# Patient Record
Sex: Male | Born: 1995 | Race: White | Hispanic: Yes | Marital: Single | Smoking: Never smoker
Health system: Southern US, Community
[De-identification: ages and names within clinical notes are randomized; demographics above are authoritative.]

## PROBLEM LIST (undated history)

## (undated) DIAGNOSIS — Q605 Renal hypoplasia, unspecified: Secondary | ICD-10-CM

## (undated) DIAGNOSIS — F909 Attention-deficit hyperactivity disorder, unspecified type: Secondary | ICD-10-CM

## (undated) DIAGNOSIS — N189 Chronic kidney disease, unspecified: Secondary | ICD-10-CM

## (undated) DIAGNOSIS — R32 Unspecified urinary incontinence: Secondary | ICD-10-CM

## (undated) HISTORY — DX: Chronic kidney disease, unspecified: N18.9

## (undated) HISTORY — DX: Attention-deficit hyperactivity disorder, unspecified type: F90.9

## (undated) HISTORY — DX: Unspecified urinary incontinence: R32

## (undated) HISTORY — DX: Renal hypoplasia, unspecified: Q60.5

---
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
## 2014-01-22 ENCOUNTER — Ambulatory Visit: Payer: Medicaid Other | Admitting: Pediatrics

## 2014-02-05 ENCOUNTER — Ambulatory Visit (INDEPENDENT_AMBULATORY_CARE_PROVIDER_SITE_OTHER): Payer: No Typology Code available for payment source | Admitting: Clinical

## 2014-02-05 ENCOUNTER — Ambulatory Visit (INDEPENDENT_AMBULATORY_CARE_PROVIDER_SITE_OTHER): Payer: Medicaid Other | Admitting: Pediatrics

## 2014-02-05 ENCOUNTER — Encounter: Payer: Self-pay | Admitting: Pediatrics

## 2014-02-05 VITALS — BP 102/66 | HR 78 | Ht 68.75 in | Wt 181.2 lb

## 2014-02-05 DIAGNOSIS — Z62820 Parent-biological child conflict: Secondary | ICD-10-CM

## 2014-02-05 DIAGNOSIS — F909 Attention-deficit hyperactivity disorder, unspecified type: Secondary | ICD-10-CM

## 2014-02-05 DIAGNOSIS — F4320 Adjustment disorder, unspecified: Secondary | ICD-10-CM

## 2014-02-05 DIAGNOSIS — Z113 Encounter for screening for infections with a predominantly sexual mode of transmission: Secondary | ICD-10-CM

## 2014-02-05 DIAGNOSIS — Z7189 Other specified counseling: Secondary | ICD-10-CM

## 2014-02-05 NOTE — Progress Notes (Signed)
Adolescent Medicine Consultation Initial Visit  Phillip Becker  is a 18 y.o. male referred by Dr. Reginold Becker here today for evaluation of ADHD.      PCP Confirmed?  yes  Phillip Givens, MD   History was provided by the patient, mother and father.  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  He has been seen and followed by Dr. Quentin Becker in the past (though not in the last 12 months) and was taking Vyvanse 50 mg until 2 months ago (mom thinks for about 3 years though is unsure).  Phillip Becker did not want to come to today's appointment  And states he has no goals for the visit but mom's sttaes her goal  is interested in restarting ADHD medication.  Phillip Becker stopped taking his medication 2 months ago because he ran out and states because he did not want to take it anymore.  He notes he has not noted any difference in school performance since stopping medication.  He is scheduled to graduate this Spring and reports he is making Bs.  Mom states she does not know of any changes in school performance.  Phillip Becker is not interested in restarting medication for ADHD and does not feel that his ADHD is currently a problem.  Phillip Becker will be graduating this year and is planning on going on a [place] abroad for two years in December.  Mom is concerned that Phillip Becker seems very irritable and sometimes depressed. Phillip Becker denies symptoms of depression or anxiety.  He denies SI/HI or thoughts of self harm.  He feels his mood is good, he describes himself as happy.  He says that that mood changes his mom is referring to are just "normal teenager stuff."  Mom is also concerned today about Phillip Becker's chronic diarrhea.  He has been evaluated for this by his PCP, though all diagnostic workup has been negative thus  far.  Mom would like a referral to GI. He is followed by [facility] nephrology for his CKD.    Phillip Becker also has a history of enuresis and has taken imipramine for this in the past.  He currently is not taking any medication for enuresis and does not want to take medication.  He reports he has an episode of wetting about twice a month.  He is setting an alarm for 2 am to use the restroom at night.  Mom is frustrated about the enuresis because Phillip Becker will go through many sets of sheets and she has to do the laundry.   No LMP for male patient.  Screenings:  PHQ-SADS PHQ-15:  4 GAD-7:  4 PHQ-9:  3 Reported problems make it not difficult to complete activities of daily functioning.  Adolescent Owens Shark ADD Scales Cluster Subtotals: Activation: 2, T-Score <50 Attention: 15, T-Score 66 Effort: 7, T-Score 53 Affect: 6, T-Score 52 Memory: 3, T-Score <50 Total Score: 30 , T-Score <40   ROS : negative unless otherwise noted in HPI  Current Outpatient Prescriptions on File Prior to Visit  Medication Sig Dispense Refill  . calcitRIOL (ROCALTROL) 0.25 MCG capsule Take 0.25 mcg by mouth once a week. Take 1 capsule (0.25 mcg total) by mouth once a week.      Marland Kitchen lisinopril (PRINIVIL,ZESTRIL) 5 MG tablet  Take 5 mg by mouth. Take 1 tablet (5 mg total) by mouth daily.      . fluticasone (FLONASE) 50 MCG/ACT nasal spray Place 2 sprays into both nostrils daily.  16 g  12  . omeprazole (PRILOSEC) 20 MG capsule Take 1 capsule (20 mg total) by mouth daily.  30 capsule  3    No Known Allergies  PMHx:  Hypoplastic left kidney CKD, stage III ADHD Enuresis Chronic diarrhea  FHX: noncontributory  Social History:  Lives with: mother, father, younger brother, older brother Parental relations: lots of conflict currently Siblings: good relationships  Friends/Peers: good School: attends 12th grade, Bs, graduating this spring Nutrition/Eating Behaviors: Dad reports he will eat anything and does not follow MD  reccomended diet changes Sleep: reports good sleep  Confidentiality was discussed with the patient and if applicable, with caregiver as well. Tobacco? no Secondhand smoke exposure?no Drugs/EtOH?no Sexually active?no Pregnancy Prevention: currently not sexually active Safe at home, in school & in relationships? Yes Safe to self? Yes  Physical Exam:  Filed Vitals:   02/05/14 0950  BP: 102/66  Pulse: 78  Height: 5' 8.75" (1.746 m)  Weight: 181 lb 3.2 oz (82.192 kg)   BP 102/66  Pulse 78  Ht 5' 8.75" (1.746 m)  Wt 181 lb 3.2 oz (82.192 kg)  BMI 26.96 kg/m2 Body mass index: body mass index is 26.96 kg/(m^2). 4.0% systolic and 98.1% diastolic of BP percentile by age, sex, and height. 139/92 is approximately the 95th BP percentile reading.  Gen: well appearing male adolescent, NAD HEENT: AT/[place], MMM Neuro: no focal deficits, alert Psych: flat affect noted at the beginning of visit though this improved with patient   Patient and/or legal guardian verbally consented to meet with [place] about presenting concerns.  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  Patient is not interested in medication and Owens Shark scale is not indicative of ADHD (positive for mild to moderate inattention).  Major issue right now seems parent-child struggle with independence and responsibilities.  Bailey is eager to be treated like an adult and make his own decisions but his parents are frustrated with his lack of responsibility.   Specific areas of frustration include waking up in the morning on his own with an alarm and his decision to not take medication for enuresis but lack of responsibility for cleaning his own sheets.  1. ADHD  - hold off on medication for now as patient is not interested and does not report active symptoms or concerns  2. Adjustment disorder -  patient and family met with Sherilyn Dacosta and discussed strategies at home to deal with some of the independence issues - f/u with Sherilyn Dacosta in 2 weeks   3. Enuresis - pt does not want medication at this time, agreed that [facility] needs to be responsible for cleaning his own linens if bed wetting occurs, agreed that privileges (aka skate boarding) with be taken away if he does not follow up with this  4. Chronic Diarrhea - refer to [facility] peds GI  Medical decision-making:  > 80 minutes spent, more than 50% of appointment was spent discussing diagnosis and management of symptoms

## 2014-02-05 NOTE — Progress Notes (Signed)
Referring Provider: Dr. Marina GoodellPerry & Dr. Zonia KiefStephens Primary Care Provider: Dr. Davis GourdA. Kavanaugh Session Time:  1100 - 1145 (45 minutes) Type of Service: Behavioral Health - Individual Interpreter: no  Interpreter Name & Language: N/A   PRESENTING CONCERNS:  Phillip Becker is a 18 y.o. male brought in by mother and father.  Phillip Becker and his family was referred to this Behavioral Health Clinician to develop a goal they were willing to work on together to decrease family conflict.   GOALS ADDRESSED:  Enhance parent child communication to identify a objectives to complete one goal.    INTERVENTIONS:  This Behavioral Health Clinician built rapport, facilitated communication & completed motivational interviewing to enhance communication & identify one goal they were all willing to work on.  [facility] identified the family's strengths and strategies they have used that have been effective.   ASSESSMENT/OUTCOME:  Phillip Becker and his parents were frustrated & overwhelmed with trying various strategies to get Phillip Becker out of bed in the morning, which caused other stressors.  Phillip Becker and his parents were open in discussing their thoughts & feelings.  They were able to identify Phillip Becker's strengths & barriers that they were addressing.  The family developed a plan for Phillip Becker to continue using both alarms clocks in his room using music with one of them. Phillip Becker was able to identify one positive thought to replace his thought of not wanting to get up in the morning.   PLAN:  Phillip Becker to continue using the rock music to wake him up, say to himself "Put my A game on" instead of "I don't want to get out of bed" and put visual reminders of why he wants to get up in the morning.  Scheduled next visit: 02/18/14  Phillip Becker, MSW, Johnson & JohnsonLCSW Behavioral Health Clinician [facility]

## 2014-02-05 NOTE — Progress Notes (Deleted)
Adolescent Medicine Consultation Initial Visit Phillip Becker  is a 18 y.o. male referred by *** here today for evaluation of ***.      PCP Confirmed?  {YES [id number]}  Angelina PihKAVANAUGH,ALISON S, MD   History was provided by the {relatives:19415}.  Chart review:  ***   No LMP for male patient.  Last STI screen: *** Other Labs: *** Immunizations: ***  HPI:  Pt reports ***  ROS Menstrual History: ***  Problem List Reviewed:  {yes/no:310449} Medication List Reviewed:   {yes/no:310449} Past Medical History Reviewed:  {yes/no:310449} Family History Reviewed:  {yes/no:310449}  Social History: Confidentiality was discussed with the patient and if applicable, with caregiver as well.  Lives with: *** Parental relations: *** Siblings: *** Friends/Peers: *** School: *** Nutrition/Eating Behaviors: *** Sports/Exercise:  *** Screen time: *** Sleep: ***  Tobacco?  {yes***/no:17258}  Secondhand smoke exposure? {yes***/no:17258} Drugs/EtOH? {yes***/no:17258}  Sexually active? {yes***/no:17258}  Safe at home, in school & in relationships? {yes***/no:17258}  Last STI Screening:*** Pregnancy Prevention: ***  Screenings: The patient completed the Rapid Assessment for Adolescent Preventive Services screening questionnaire and the following topics were identified as risk factors and discussed:{CHL AMB ASSESSMENT TOPICS:21012045}    Additional Screening:  ***  {Common ambulatory SmartLinks:19316}  Physical Exam:  Filed Vitals:   02/05/14 0950  BP: 102/66  Pulse: 78  Height: 5' 8.75" (1.746 m)  Weight: 181 lb 3.2 oz (82.192 kg)   BP 102/66  Pulse 78  Ht 5' 8.75" (1.746 m)  Wt 181 lb 3.2 oz (82.192 kg)  BMI 26.96 kg/m2 Body mass index: body mass index is 26.96 kg/(m^2). 3.9% systolic and 28.3% diastolic of BP percentile by age, sex, and height. 139/92 is approximately the 95th BP percentile reading.  ***  Assessment/Plan: ***  Medical decision-making:  - *** minutes spent,  more than 50% of appointment was spent discussing diagnosis and management of symptoms

## 2014-02-06 LAB — GC/CHLAMYDIA PROBE AMP, URINE
Chlamydia, Swab/Urine, PCR: NEGATIVE
GC PROBE AMP, URINE: NEGATIVE

## 2014-02-14 NOTE — Progress Notes (Signed)
Attending Physician Co-Signature  I saw and evaluated the patient, performing the key elements of the service.  I developed  the management plan that is described in the resident's note, and I agree with the content.  Kathlee Barnhardt [place] Bartlomiej Jenkinson, MD  

## 2014-02-18 ENCOUNTER — Encounter: Payer: Self-pay | Admitting: Clinical

## 2014-09-21 ENCOUNTER — Encounter: Payer: Self-pay | Admitting: Pediatrics

## 2014-09-21 ENCOUNTER — Ambulatory Visit (INDEPENDENT_AMBULATORY_CARE_PROVIDER_SITE_OTHER): Payer: Medicaid Other | Admitting: Pediatrics

## 2014-09-21 VITALS — BP 100/70 | Wt 186.1 lb

## 2014-09-21 DIAGNOSIS — Z23 Encounter for immunization: Secondary | ICD-10-CM

## 2014-09-21 DIAGNOSIS — M5489 Other dorsalgia: Secondary | ICD-10-CM

## 2014-09-21 DIAGNOSIS — M549 Dorsalgia, unspecified: Secondary | ICD-10-CM | POA: Insufficient documentation

## 2014-09-21 NOTE — Assessment & Plan Note (Signed)
I encouraged good posture, regular exercise.  Referral to ortho regarding the back pain and mom's concern about osteopenia.  Ibuprofen PRn.  Sugggested that martial arts might be a good fit for him and help him to learn better body position.

## 2014-09-21 NOTE — Progress Notes (Signed)
Subjective:    Phillip Becker is a 18 y.o. old male here with his mother for Back Pain .    Back Pain This is a chronic problem. The current episode started more than 1 year ago. The problem is unchanged. Pain location: lower back - left side extending up to flank. The pain is mild. Worse during: worse in the morning or after sitting for a long time (ie playing his instrument) The symptoms are aggravated by sitting and lying down. Risk factors: history of chronic kidney disease, history of scoliosis. He has tried nothing for the symptoms.    He states he has had back pain all his life.  It is not really worse now.  The reason mom brought him in is really that she is more concerned about his posture.  She wants to know if something is wrong with his bones, she understood from his nephrologist at some point that he might have osteopenia.  She would like for him to see a specialist.    He feels better with the back pain when he regularly engages in exercise such as running and jumproping.  These activities do not make his pain worse.  He has no radiculopathy.   Mom states he needs refills and lab orders related to his chronic kidney disease.  I suggested she get in touch with his nephrologist and she agreed.   Review of Systems  Musculoskeletal: Positive for back pain.    History and Problem List: Phillip Becker has GERD (gastroesophageal reflux disease); Chronic diarrhea; ADHD (attention deficit hyperactivity disorder); Hypoplastic Kidney; Chronic kidney disease (CKD), stage III (moderate); Enuresis; Overweight, pediatric, BMI 85.0-94.9 percentile for age; Chronic throat clearing; Sinusitis; and Back pain on his problem list.  Phillip Becker  has a past medical history of Chronic kidney disease; ADHD (attention deficit hyperactivity disorder); Enuresis; and Hypoplastic kidney.  Immunizations needed: flu     Objective:    BP 100/70 mmHg  Wt 186 lb 1.6 oz (84.414 kg) Physical Exam  Constitutional: He appears  well-nourished. No distress.  HENT:  Head: Normocephalic and atraumatic.  Eyes: Conjunctivae are normal.  Neck: Normal range of motion.  Musculoskeletal: Normal range of motion. He exhibits no tenderness.       Cervical back: He exhibits normal range of motion, no tenderness and no swelling.       Thoracic back: He exhibits normal range of motion, no tenderness, no bony tenderness and no swelling.       Lumbar back: He exhibits normal range of motion, no tenderness, no swelling and no deformity.       Back:  No obvious  scoliosis  Skin: Skin is warm and dry. Rash (acne over back) noted.  Nursing note and vitals reviewed.      Assessment and Plan:     Phillip Becker was seen today for Back Pain .   Problem List Items Addressed This Visit      Other   Back pain - Primary    I encouraged good posture, regular exercise.  Referral to ortho regarding the back pain and mom's concern about osteopenia.  Ibuprofen PRn.  Sugggested that martial arts might be a good fit for him and help him to learn better body position.    Relevant Orders      Ambulatory referral to Orthopedics    Other Visit Diagnoses    Need for vaccination        Relevant Orders       Flu Vaccine QUAD with presevative (  Completed)       Return for follow up as needed -also due for checkup.  Angelina PihKAVANAUGH,Marquia Costello S, MD

## 2014-09-23 ENCOUNTER — Other Ambulatory Visit (HOSPITAL_COMMUNITY): Payer: Self-pay | Admitting: Orthopedic Surgery

## 2014-09-23 DIAGNOSIS — M810 Age-related osteoporosis without current pathological fracture: Secondary | ICD-10-CM

## 2014-09-30 ENCOUNTER — Encounter: Payer: Self-pay | Admitting: Pediatrics

## 2014-10-11 ENCOUNTER — Ambulatory Visit (HOSPITAL_COMMUNITY): Admission: RE | Admit: 2014-10-11 | Payer: Self-pay | Source: Ambulatory Visit

## 2014-10-13 ENCOUNTER — Ambulatory Visit (HOSPITAL_COMMUNITY)
Admission: RE | Admit: 2014-10-13 | Discharge: 2014-10-13 | Disposition: A | Discharge status: Home / Self Care | Payer: Medicaid Other | Source: Ambulatory Visit | Attending: Orthopedic Surgery | Admitting: Orthopedic Surgery

## 2014-10-13 DIAGNOSIS — M810 Age-related osteoporosis without current pathological fracture: Secondary | ICD-10-CM

## 2014-10-13 DIAGNOSIS — Z13820 Encounter for screening for osteoporosis: Secondary | ICD-10-CM | POA: Diagnosis not present

## 2014-10-19 ENCOUNTER — Encounter: Payer: Self-pay | Admitting: Pediatrics

## 2014-10-19 DIAGNOSIS — M40204 Unspecified kyphosis, thoracic region: Secondary | ICD-10-CM | POA: Insufficient documentation

## 2015-04-19 ENCOUNTER — Encounter: Payer: Self-pay | Admitting: Pediatrics

## 2015-04-19 ENCOUNTER — Ambulatory Visit (INDEPENDENT_AMBULATORY_CARE_PROVIDER_SITE_OTHER): Payer: Medicaid Other | Admitting: Pediatrics

## 2015-04-19 VITALS — BP 124/60 | Ht 68.5 in | Wt 194.8 lb

## 2015-04-19 DIAGNOSIS — Z68.41 Body mass index (BMI) pediatric, 85th percentile to less than 95th percentile for age: Secondary | ICD-10-CM

## 2015-04-19 DIAGNOSIS — Z113 Encounter for screening for infections with a predominantly sexual mode of transmission: Secondary | ICD-10-CM

## 2015-04-19 DIAGNOSIS — Z789 Other specified health status: Secondary | ICD-10-CM | POA: Diagnosis not present

## 2015-04-19 DIAGNOSIS — N183 Chronic kidney disease, stage 3 (moderate): Secondary | ICD-10-CM

## 2015-04-19 DIAGNOSIS — Z00121 Encounter for routine child health examination with abnormal findings: Secondary | ICD-10-CM

## 2015-04-19 DIAGNOSIS — Z23 Encounter for immunization: Secondary | ICD-10-CM

## 2015-04-19 NOTE — Patient Instructions (Addendum)
  Place appropriate patient instructions here.  Date Type Specialty Providers Description  08/10/2015 Appointment Nephrology Larose HiresSorkin, Jeremy J, MD  [address]  [place], [place] [address]  [phone]  [phone] (Fax)

## 2015-04-19 NOTE — Progress Notes (Signed)
Routine Well-Adolescent Visit   PCP: Jairo Ben, MD   History was provided by the patient.  Phillip Becker is a 19 y.o. male who is here for [facility].   Current concerns: No concerns.   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   Plans to stay for 2 years. Location of mission trip unknown currently. Followed up with pediatric nephrologist Dr. Ronnell Guadalajara at [facility] on 04/05/2015.    Patient indicates previous GFR was high and creatinine improved.     Adolescent Assessment:  Confidentiality was discussed with the patient and if applicable, with caregiver as well.  Home and Environment:  Lives with: lives at home with mom, dad, younger brother  Parental relations: Good relationships.  Friends/Peers: Good relationships.  Nutrition/Eating Behaviors: Balanced diet, with some fast food  Sports/Exercise:  Gym, running, free weights:  6 days/week, 3-4 hours a day    Education and Employment:  School Status: not in school School History: Graduated from [employer], plans to do mission work. 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   Patient reports being comfortable and safe at  at home? Yes  Smoking: no Secondhand smoke exposure? no Drugs/EtOH: No.     Sexuality:  -Menarche: not applicable in this male child. - Sexually active? No, remaining abstinence  - sexual partners in last year: zero.  - contraception use: abstinence - Last STI Screening: 02/05/14, 04/19/15  - Violence/Abuse: None.   Mood: Suicidality and Depression: None.  Weapons: Access.   Screenings: The patient completed the Rapid Assessment for Adolescent Preventive Services screening questionnaire and the following topics were identified as  risk factors and discussed: healthy eating, exercise and weapon use  In addition, the following topics were discussed as part of anticipatory guidance healthy peer interactions.Marland Kitchen  PHQ-9 completed and results indicated no current risk of depression.    Physical Exam:  BP 124/60 mmHg  Ht 5' 8.5" (1.74 m)  Wt 194 lb 12.8 oz (88.361 kg)  BMI 29.19 kg/m2 Blood pressure percentiles are 61% systolic and 6% diastolic based on 2000 NHANES data.   General Appearance:   alert, oriented, no acute distress.  Polite and cooperative young man.   HENT: Normocephalic, no obvious abnormality, PERRL, EOM's intact, conjunctiva clear  Mouth:   Normal appearing teeth, no obvious discoloration, dental caries, or dental caps  Neck:   Supple  Lungs:   Clear to auscultation bilaterally, normal work of breathing  Heart:   Regular rate and rhythm, S1 and S2 normal, no murmurs;   Abdomen:   Soft, non-tender, no mass, or organomegaly  GU normal male genitals, no testicular masses or hernia  Musculoskeletal:   Tone and strength strong and symmetrical, all extremities               Lymphatic:   No cervical adenopathy  Skin/Hair/Nails:   Skin warm, dry and intact, no rashes, no bruises or petechiae  Neurologic:   Strength, gait, and coordination normal and age-appropriate    Assessment/Plan:  Phillip Becker is a 19 y.o. male with a past medical history of hypoplastic kidney disease.  Plan to transition to adult medical care.   1. Routine screening for STI (sexually transmitted infection) -Previous STI screening GC/chlamydia negative (  2015) - GC/chlamydia probe amp, urine  2. Need for vaccination - Td vaccine greater than or equal to 7yo preservative free IM  3. 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 However will contact Dr.  Ronnell GuadalajaraKeisha Gibson for travel recommendations.    4. Body mass index, pediatric, 85th percentile to less than 95th percentile for age -BMI: is not appropriate for age -Encouraged patient to remain physically active  -Promoted healthy eating   5. Engages in travel abroad - Patient plans to complete mission work in the next few months.  -TB screening required prior to leaving.   -Ordered PPD -Pt unsure of the destination of mission trip at this time.  Plans to live in the area for 2 years.  -Follow-up visit in 3 days for next visit for PPD check, or sooner as needed.   Immunization status: up to date and documented.    Lavella HammockEndya Catlin Doria, MD

## 2015-04-20 LAB — GC/CHLAMYDIA PROBE AMP, URINE
CHLAMYDIA, SWAB/URINE, PCR: NEGATIVE
GC PROBE AMP, URINE: NEGATIVE

## 2015-04-20 NOTE — Progress Notes (Signed)
I saw and evaluated the patient, performing the key elements of the service. I developed the management plan that is described in the resident's note, and I agree with the content.  This patient has CKD with hypoplastic kidneys. His renal function is stable. He is on blood pressure meds and calcitriol. He has been approved for a 2 year mission trip as long as he has access to nephrology, medical care, and necessary medications.   Nani Ingram D                  04/20/2015, 9:49 AM

## 2015-04-21 ENCOUNTER — Ambulatory Visit: Payer: Medicaid Other | Admitting: *Deleted

## 2015-04-21 DIAGNOSIS — Z111 Encounter for screening for respiratory tuberculosis: Secondary | ICD-10-CM

## 2015-04-21 LAB — TB SKIN TEST
Induration: 0 mm
TB Skin Test: NEGATIVE

## 2016-03-16 ENCOUNTER — Emergency Department (HOSPITAL_COMMUNITY)
Admission: EM | Admit: 2016-03-16 | Discharge: 2016-03-16 | Disposition: A | Discharge status: Home / Self Care | Payer: Worker's Compensation | Attending: Emergency Medicine | Admitting: Emergency Medicine

## 2016-03-16 ENCOUNTER — Encounter (HOSPITAL_COMMUNITY): Payer: Self-pay | Admitting: Oncology

## 2016-03-16 DIAGNOSIS — Y929 Unspecified place or not applicable: Secondary | ICD-10-CM | POA: Insufficient documentation

## 2016-03-16 DIAGNOSIS — Y9389 Activity, other specified: Secondary | ICD-10-CM | POA: Diagnosis not present

## 2016-03-16 DIAGNOSIS — S61209A Unspecified open wound of unspecified finger without damage to nail, initial encounter: Secondary | ICD-10-CM

## 2016-03-16 DIAGNOSIS — F909 Attention-deficit hyperactivity disorder, unspecified type: Secondary | ICD-10-CM | POA: Insufficient documentation

## 2016-03-16 DIAGNOSIS — S61012A Laceration without foreign body of left thumb without damage to nail, initial encounter: Secondary | ICD-10-CM | POA: Diagnosis not present

## 2016-03-16 DIAGNOSIS — W269XXA Contact with unspecified sharp object(s), initial encounter: Secondary | ICD-10-CM | POA: Insufficient documentation

## 2016-03-16 DIAGNOSIS — S6992XA Unspecified injury of left wrist, hand and finger(s), initial encounter: Secondary | ICD-10-CM | POA: Diagnosis present

## 2016-03-16 DIAGNOSIS — Y990 Civilian activity done for income or pay: Secondary | ICD-10-CM | POA: Diagnosis not present

## 2016-03-16 DIAGNOSIS — N189 Chronic kidney disease, unspecified: Secondary | ICD-10-CM | POA: Insufficient documentation

## 2016-03-16 MED ORDER — BACITRACIN 500 UNIT/GM EX OINT: 1.0000 "application " | TOPICAL_OINTMENT | Freq: Two times a day (BID) | CUTANEOUS | Status: AC

## 2016-03-16 MED ORDER — TETANUS-DIPHTH-ACELL PERTUSSIS 5-2.5-18.5 LF-MCG/0.5 IM SUSP
0.5000 mL | Freq: Once | INTRAMUSCULAR | Status: AC
  Administered 2016-03-16: 0.5 mL via INTRAMUSCULAR
  Filled 2016-03-16: qty 0.5

## 2016-03-16 NOTE — ED Notes (Signed)
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  Pt brought the severed piece of thumb with him.  Bleeding is controlled at this time. Pt rates pain 8/10, throbbing/burning in nature

## 2016-03-16 NOTE — Progress Notes (Signed)
Left thumb lac site cleansed and new dressing applied.

## 2016-03-16 NOTE — Discharge Instructions (Signed)
Read the information below.  Use the prescribed medication as directed.  Please discuss all new medications with your pharmacist.  You may return to the Emergency Department at any time for worsening condition or any new symptoms that concern you.    If you develop redness, swelling, pus draining from the wound, or fevers greater than 100.4, return to the ER immediately for a recheck.     Traumatic Finger Amputation A traumatic finger amputation is when you lose part or all of a finger because of an accident or injury. The severity of this type of injury can vary widely. It can mean that just the tip of your finger gets ripped off (avulsion), or it can mean that you completely lose a finger (amputation). Traumatic finger amputation is a medical emergency. It requires immediate care to prevent further damage and to save the finger. CAUSES A traumatic finger amputation usually results from an accident that involves:  A car.  Power tools.  Factory work.  Farm or Risk managerlawn equipment. SYMPTOMS  Symptoms of a traumatic finger amputation include:  Bleeding.  Pain.  Damage to surrounding tissues, such as bones, muscles, tendons, and skin. Severe injuries can sometimes lead to shock, which is a life-threatening condition in which your body fails to get blood, oxygen, and nutrients to vital organs. Some common symptoms of shock include low blood pressure, sweating, weakness, and pale skin. DIAGNOSIS Your health care provider will do a physical exam and ask for details about how your injury occurred. The physical exam will help your health care provider to determine the severity of the injury and the best way to repair it. X-rays may be done to check for damage to the surrounding bones and tissues. TREATMENT Treatment depends on the type of injury that you have and how bad it is.  Your health care provider will clean the wound thoroughly and apply a medicine (anesthetic) to relieve pain.  If just the  tip of your finger was removed, the wound will typically heal on its own with a protective bandage (dressing) and regular cleaning.  For more severe injuries, a portion of skin may need to be taken from another part of the body (graft) and attached to the wound site until the wound heals.  If a large portion of the finger was amputated, it may be possible to reattach it surgically (replantation). HOME CARE INSTRUCTIONS  Take medicines only as directed by your health care provider.  If you were prescribed an antibiotic medicine, finish all of it even if you start to feel better.  There are many different ways to close and cover a wound, including stitches (sutures), skin glue, and adhesive strips. Follow your health care provider's instructions about:  Wound care.  Dressing changes and removal.  Wound closure removal.  Check your wound every day for signs of infection. Watch for:  Redness, swelling, or pain.  Fluid, blood, or pus.  Do exercises to strengthen your finger and hand as directed by your health care provider. SEEK MEDICAL CARE IF:  Your wound does not seem to be healing well.  You have redness, swelling, or pain at your wound site.  You have fluid, blood, or pus coming from your wound.  You have a fever.   This information is not intended to replace advice given to you by your health care provider. Make sure you discuss any questions you have with your health care provider.   Document Released: 12/10/2001 Document Revised: 10/22/2014 Document Reviewed: 06/16/2014  Chartered certified accountant Patient Education [employer].

## 2016-03-16 NOTE — ED Provider Notes (Signed)
CSN: [id number]     Arrival date & time 03/16/16  2021 History  By signing my name below, I, Ronney Lion, attest that this documentation has been prepared under the direction and in the presence of [facility], PA-C. Electronically Signed: Ronney Lion, ED Scribe. 03/16/2016. 8:58 PM.    Chief Complaint  Patient presents with  . Finger Injury   The history is provided by the patient. No language interpreter was used.   HPI Comments: Phillip Becker is a 20 y.o. male who presents to the Emergency Department s/p accidentally cutting off the tip of his left thumb while cutting lettuce at work approximately 1 hour PTA. Patient brings with him the severed thumb tip with him, which he had wrapped in a dry towel. He complains of associated pain to the tip of his thumb but otherwise denies any additional injuries. He denies being on any anticoagulants. Patient states he cannot remember the date of his last tetanus vaccination. He denies trying any treatments or medications prior to arrival. Patient states he is right-hand-dominant.  Past Medical History  Diagnosis Date  . Chronic kidney disease   . ADHD (attention deficit hyperactivity disorder)   . Enuresis   . Hypoplastic kidney    History reviewed. No pertinent past surgical history. No family history on file. Social History  Substance Use Topics  . Smoking status: Never Smoker   . Smokeless tobacco: Never Used  . Alcohol Use: No    Review of Systems  Constitutional: Negative for fever and chills.  Musculoskeletal: Negative for myalgias and arthralgias.  Skin: Positive for wound. Negative for color change and pallor.  Allergic/Immunologic: Negative for immunocompromised state.  Neurological: Negative for weakness and numbness.  Hematological: Does not bruise/bleed easily.  Psychiatric/Behavioral: Positive for self-injury (accidental ).   Allergies  Review of patient's allergies indicates no known allergies.  Home Medications   Prior to  Admission medications   Medication Sig Start Date End Date Taking? Authorizing Provider  Ascorbic Acid (VITAMIN C) 1000 MG tablet Take 1,000 mg by mouth.    Historical Provider, MD  calcitRIOL (ROCALTROL) 0.25 MCG capsule Take 0.25 mcg by mouth daily. 04/05/15   Historical Provider, MD  lisinopril (PRINIVIL,ZESTRIL) 5 MG tablet Take 5 mg by mouth. Take 1 tablet (5 mg total) by mouth daily. 04/21/13 04/21/14  Historical Provider, MD  lisinopril (PRINIVIL,ZESTRIL) 5 MG tablet Take 5 mg by mouth daily. 04/05/15   Historical Provider, MD  Melatonin 5 MG CAPS Take 5 mg by mouth. 04/05/15   Historical Provider, MD   BP 149/50 mmHg  Pulse 59  Temp(Src) 98.2 F (36.8 C) (Oral)  Resp 20  Ht  (1.727 m)  Wt 170 lb (77.111 kg)  BMI 25.85 kg/m2  SpO2 99% Physical Exam  Constitutional: He appears well-developed and well-nourished. No distress.  HENT:  Head: Normocephalic and atraumatic.  Neck: Neck supple.  Pulmonary/Chest: Effort normal.  Musculoskeletal:  Left thumb with skin avulsion at tip, tiny distal edge of nail also cut off.  Wound is hemostatic.  Skin fragment that pt transported is thin, pale, dry, and cold.    Neurological: He is alert.  Skin: He is not diaphoretic.  Nursing note and vitals reviewed.   ED Course  Procedures (including critical care time)  DIAGNOSTIC STUDIES: Oxygen Saturation is 99% on RA, normal by my interpretation.    COORDINATION OF CARE: 8:57 PM - Discussed treatment plan with pt at bedside which includes wound care. Discussed with pt that  the tip of the thumb cannot be reattached, as the tip of the thumb is no longer viable. Pt verbalized understanding and agreed to plan.   MDM   Final diagnoses:  Fingertip avulsion, initial encounter   Afebrile, nontoxic patient with injury to his injury to his left thumb while cutting vegetables at work.  Wound is not very deep, small piece of skin brought in is not amenable to reattachment.  Neurovascularly intact, no  tendon or bony involvement.  Distal tip of nail cut off but no significant or repairable nailbed injury.   Xray not indicated.  Wound care by nurse.   D/C home with wound care instructions, antibiotic ointment, work restrictions while healing, return precautions.  Discussed result, findings, treatment, and follow up  with patient.  Pt given return precautions.  Pt verbalizes understanding and agrees with plan.     Tdap booster given. Pt has no co morbidities to effect normal wound healing. Pt is hemodynamically stable w no complaints prior to dc.     I personally performed the services described in this documentation, which was scribed in my presence. The recorded information has been reviewed and is accurate.     Trixie Dredgemily Demetris Meinhardt, PA-C 03/16/16 2129  Laurence Spatesachel Morgan Little, MD 03/17/16 [phone]

## 2016-05-02 ENCOUNTER — Encounter: Payer: Self-pay | Admitting: Pediatrics

## 2016-05-02 ENCOUNTER — Ambulatory Visit (INDEPENDENT_AMBULATORY_CARE_PROVIDER_SITE_OTHER): Payer: BLUE CROSS/BLUE SHIELD | Admitting: Pediatrics

## 2016-05-02 VITALS — BP 120/84 | Ht 68.75 in | Wt 171.8 lb

## 2016-05-02 DIAGNOSIS — Z0001 Encounter for general adult medical examination with abnormal findings: Secondary | ICD-10-CM

## 2016-05-02 DIAGNOSIS — N183 Chronic kidney disease, stage 3 unspecified: Secondary | ICD-10-CM

## 2016-05-02 DIAGNOSIS — Z68.41 Body mass index (BMI) pediatric, 85th percentile to less than 95th percentile for age: Secondary | ICD-10-CM

## 2016-05-02 DIAGNOSIS — Q602 Renal agenesis, unspecified: Secondary | ICD-10-CM | POA: Diagnosis not present

## 2016-05-02 DIAGNOSIS — Q605 Renal hypoplasia, unspecified: Secondary | ICD-10-CM

## 2016-05-02 DIAGNOSIS — E663 Overweight: Secondary | ICD-10-CM | POA: Diagnosis not present

## 2016-05-02 DIAGNOSIS — Z113 Encounter for screening for infections with a predominantly sexual mode of transmission: Secondary | ICD-10-CM

## 2016-05-02 LAB — POCT URINALYSIS DIPSTICK
Bilirubin, UA: NEGATIVE
Blood, UA: NEGATIVE
GLUCOSE UA: NEGATIVE
Ketones, UA: NEGATIVE
Leukocytes, UA: NEGATIVE
NITRITE UA: NEGATIVE
Protein, UA: NEGATIVE
Spec Grav, UA: <=1.005
Urobilinogen, UA: NEGATIVE
pH, UA: 7

## 2016-05-02 NOTE — Patient Instructions (Addendum)
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 You will need access to a kidney specialist while you are traveling.  You should take your records from the Kidney Specialist with you and can request them from the office prior to travel. Your next routine appointment is 08/2016. You need to maintain good hydration and eat a low salt diet. If you have headaches, blurred vision, elevated blood pressure, or changes in your urine you must seek medical advice. Continue taking your medications as prescribed by your nephrologist. Make sure you have a prescription to take with you while traveling.   Dental list          updated 1.22.15 These dentists all accept Medicaid.  The list is for your convenience in choosing your child's dentist. Estos dentistas aceptan Medicaid.  La lista es para su Guamconveniencia y es una cortesa.     [facility]     [phone] [address]  [address] [place] [address] Se habla espaol From 481 to 21 years old Parent may go with child Vinson MoselleBryan Cobb DDS     [phone] [address] [place]  [id number] Se habla espaol From 302 to 21 years old Parent may NOT go with child  Marolyn HammockSilva and Silva DMD    [phone] [address] [place] [address] Se habla espaol [place]Vietnamese spoken From 21 years old Parent may go with child [facility]     [phone] [address] [place] [id number] Se habla espaol From 371 to 21 years old Parent may NOT go with child  Winfield Rasthane Hisaw DDS     [phone] [facility]      [address]  [place] [address] No se habla espaol From teeth coming in Parent may go with child  [facility]     [phone] [address] [place] [address] Requires certification. Call for information. Requiere certificacin. Llame para informacin. Algunos dias se habla espaol  From birth to 20 years Parent possibly goes with child  Bradd CanaryHerbert McNeal DDS      [phone] [id number] [phone] [address]  [address] [place] [address] Se habla espaol From 18 months to 18 years  Parent may go with child  J. Jemez SpringsHoward McMasters DDS    [phone] Garlon HatchetEric J. Sadler DDS [address] [place] [address] Se habla espaol From 21 year old Parent may go with child  Melynda Rippleerry Jeffries DDS    [phone] [address] [place] [address] Se habla espaol  From 5418 months old Parent may go with child Dorian PodJ. Selig Cooper DDS    [phone] [address] [place] [address] Se habla espaol From 465 to 641 years old Parent may go with child  [facility]    [phone] [address] [place] [address] No se habla espaol From birth Parent may not go with child

## 2016-05-02 NOTE — Progress Notes (Signed)
Adolescent Well Care Visit Fedrick A Madelyn Flavorsrgueta is a 20 y.o. male who is here for well care.    PCP:  Jairo BenMCQUEEN,Mael Delap D, MD   History was provided by the patient and mother.  Current Issues: Current concerns include Here for annual CPE. Will turn 20 in 2 months. He plans to go on a mission trip for 2 years with the [employer]. He does not where he is going yet but he will be assigned to go somewhere with access to medical care.  Prior Concerns: CKD and hypoplastic kidneys bilaterally. [facility] but [facility]. He has stable Stage 3 CKD with BUN/Cr 20/2.02. He was not compliant with lisinopril 5 mg daily and the need to take it was reinforced at a recent visit 02/2016. He reports that he is now compliant with mediction. He does not take calcitriol regularly because it upsets his stomach.  Nutrition: Nutrition/Eating Behaviors: vegan. Almond milk with Vit D. Beans and rice.  Adequate calcium in diet?: No and not taking calcitriol Supplements/ Vitamins: multivitamon  Exercise/ Media: Play any Sports?/ Exercise: active Screen Time:  < 2 hours Media Rules or Monitoring?: yes  Sleep:  Sleep: No current problems, No longer taking melatonin  Social Screening: Lives with:  Mom and Dad but about to leave for 2 years Parental relations:  good Activities, Work, and Regulatory affairs officerChores?: yes Concerns regarding behavior with peers?  no Stressors of note: no  Education: School Name: Completed school. Plans college. Wants to study acting    Menstruation:   No LMP for male patient.   Tobacco?  no Secondhand smoke exposure?  no Drugs/ETOH?  no  Sexually Active?  no   Pregnancy Prevention: abstinence  Safe at home, in school & in relationships?  Yes Safe to self?  Yes   Screenings: Patient has a dental home: no - list given  The patient completed the Rapid Assessment for Adolescent Preventive Services screening questionnaire and the following  topics were identified as risk factors and discussed: weapon use  In addition, the following topics were discussed as part of anticipatory guidance healthy eating, exercise, weapon use, tobacco use, marijuana use, drug use, condom use and birth control.  PHQ-9 completed and results indicated no current concerns. Score 0. Passed history of adjustment issues. Currently denies. He is looking forward to mission work and returning to go to college and study acting. He no longer has sleep problems and is exercising regularly.  Physical Exam:  Filed Vitals:   05/02/16 1456  BP: 120/84  Height: 5' 8.75" (1.746 m)  Weight: 171 lb 12.8 oz (77.928 kg)  Facility age limit for growth percentiles is 20 years.  BP 120/84 mmHg  Ht 5' 8.75" (1.746 m)  Wt 171 lb 12.8 oz (77.928 kg)  BMI 25.56 kg/m2 Body mass index: body mass index is 25.56 kg/(m^2). Facility age limit for growth percentiles is 20 years.   Hearing Screening   Method: Audiometry   125Hz  250Hz  500Hz  1000Hz  2000Hz  4000Hz  8000Hz   Right ear:   20 20 20 20    Left ear:   20 20 20 20      Visual Acuity Screening   Right eye Left eye Both eyes  Without correction: 20/20 20/20   With correction:       General Appearance:   alert, oriented, no acute distress and well nourished  HENT: Normocephalic, no obvious abnormality, conjunctiva clear  Mouth:   Normal appearing teeth, no obvious discoloration,  dental caries, or dental caps  Neck:   Supple; thyroid: no enlargement, symmetric, no tenderness/mass/nodules     Lungs:   Clear to auscultation bilaterally, normal work of breathing  Heart:   Regular rate and rhythm, S1 and S2 normal, no murmurs;   Abdomen:   Soft, non-tender, no mass, or organomegaly  GU normal male genitals, no testicular masses or hernia, Tanner stage 5  Musculoskeletal:   Tone and strength strong and symmetrical, all extremities               Lymphatic:   No cervical adenopathy  Skin/Hair/Nails:   Skin warm, dry and  intact, no rashes, no bruises or petechiae  Neurologic:   Strength, gait, and coordination normal and age-appropriate     Assessment and Plan:   1. 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 He is planning a mission trip for 2 years-leaving in august. His kidney disease is stable and he will be going to a country with access to medical care. He is currently compliant with his medications.   2. Body mass index, pediatric, 85th percentile to less than 95th percentile for age He is now a vegan and exercises daily. His BMI is stable. He takes calcitriol and a multivitamin daily. He reports adequate protein intake and maintains low salt. He is aware of the need to stay hydrated.  3. Overweight As above  4. Chronic kidney disease (CKD), stage III (moderate) Recent appointment with nephrologist record reviewed. I recommend that he consult with nephrologist about his travesl and obtain a medical summery and prescription refills for his travels.  - POCT urinalysis dipstick  5. Hypoplastic Kidney As above  6. Routine screening for STI (sexually transmitted infection)  - GC/Chlamydia Probe Amp   BMI is not appropriate for age  Hearing screening result:normal Vision screening result: normal  Next CPE will be with adult MD   Return for No follow up scheduled. Wil need adult health...  Jairo Ben, MD

## 2016-05-03 LAB — GC/CHLAMYDIA PROBE AMP
CT Probe RNA: NOT DETECTED
GC PROBE AMP APTIMA: NOT DETECTED

## 2016-05-09 ENCOUNTER — Encounter: Payer: Self-pay | Admitting: Pediatrics

## 2016-05-10 ENCOUNTER — Encounter: Payer: Self-pay | Admitting: Pediatrics

## 2016-05-14 ENCOUNTER — Encounter: Payer: Self-pay | Admitting: Pediatrics

## 2016-05-25 ENCOUNTER — Telehealth: Payer: Self-pay | Admitting: Pediatrics

## 2016-05-25 NOTE — Telephone Encounter (Signed)
Please call Phillip Becker as soon form is ready for pick up as soon as possible

## 2016-05-28 NOTE — Telephone Encounter (Signed)
Placed in provider box for completion.

## 2016-05-30 NOTE — Telephone Encounter (Signed)
Form completed by PCP, form copied, and given to front desk for parent to pickup.  

## 2016-05-30 NOTE — Telephone Encounter (Signed)
Called mom and let her know the form is ready to be picked up. °

## 2016-08-27 ENCOUNTER — Encounter: Payer: Self-pay | Admitting: *Deleted

## 2016-08-27 NOTE — Progress Notes (Unsigned)
Screening completed.

## 2016-08-30 ENCOUNTER — Other Ambulatory Visit: Payer: Self-pay | Admitting: Pediatrics

## 2016-08-30 NOTE — Telephone Encounter (Signed)
Called mother back and let her know she needs to follow up with Nephrology and states she plans to do so.

## 2016-08-30 NOTE — Telephone Encounter (Signed)
Medication was prescribed by Dr[facility] Allayne GitelmanKavanaugh here at [facility]. I did prompt mother that this may need to be [facility] considering childs age. However, she states that patient will be leaving for mission trip December 13 and will not be able to get in with new practice by that time. Will route again to physician to consider and advise.

## 2016-08-30 NOTE — Telephone Encounter (Signed)
Pt's mom called requesting a prescription refill for imipramine 25 MG tablet. Stated this medication was prescribed by Dr. Allayne GitelmanKavanaugh couple years ago. Mom knows pt should be transfer to an adult clinic since pt is already 20 y/o.

## 2016-09-04 ENCOUNTER — Ambulatory Visit (INDEPENDENT_AMBULATORY_CARE_PROVIDER_SITE_OTHER): Payer: BLUE CROSS/BLUE SHIELD | Admitting: *Deleted

## 2016-09-04 DIAGNOSIS — Z23 Encounter for immunization: Secondary | ICD-10-CM | POA: Diagnosis not present

## 2019-06-15 ENCOUNTER — Other Ambulatory Visit: Payer: Self-pay

## 2019-06-15 DIAGNOSIS — Z20822 Contact with and (suspected) exposure to covid-19: Secondary | ICD-10-CM

## 2019-06-17 LAB — NOVEL CORONAVIRUS, NAA: SARS-CoV-2, NAA: NOT DETECTED

## 2019-06-19 ENCOUNTER — Telehealth: Payer: Self-pay | Admitting: General Practice

## 2019-06-19 NOTE — Telephone Encounter (Signed)
Negative COVID results given. Patient results "NOT Detected." Caller expressed understanding. ° °

## 2021-11-16 ENCOUNTER — Telehealth: Payer: Self-pay | Admitting: Nurse Practitioner

## 2021-11-16 NOTE — Progress Notes (Deleted)
[facility] [address] [place] [address] Dept: [phone] Dept Fax: [phone]  Virtual Video Visit  I connected with Phillip Becker on 11/16/21 at 10:00 AM EST by a video enabled telemedicine application and verified that I am speaking with the correct person using two identifiers.  Location patient: Home Location provider: Clinic Persons participating in the virtual visit: Patient, Provider, CMA  I discussed the limitations of evaluation and management by telemedicine and the availability of in person appointments. The patient expressed understanding and agreed to proceed.  No chief complaint on file.   SUBJECTIVE:  HPI: Phillip Becker is a 26 y.o. male who presents for new patient visit to establish care.  Introduced to Designer, jewellery role and practice setting.  All questions answered.  Discussed provider/patient relationship and expectations. He endorses cough, congestion.   UPPER RESPIRATORY TRACT INFECTION  Worst symptom: Fever: {Blank single:19197::"yes","no"} Cough: {Blank single:19197::"yes","no"} Shortness of breath: {Blank single:19197::"yes","no"} Wheezing: {Blank single:19197::"yes","no"} Chest pain: {Blank single:19197::"yes","no","yes, with cough"} Chest tightness: {Blank single:19197::"yes","no"} Chest congestion: {Blank single:19197::"yes","no"} Nasal congestion: {Blank single:19197::"yes","no"} Runny nose: {Blank single:19197::"yes","no"} Post nasal drip: {Blank single:19197::"yes","no"} Sneezing: {Blank single:19197::"yes","no"} Sore throat: {Blank single:19197::"yes","no"} Swollen glands: {Blank single:19197::"yes","no"} Sinus pressure: {Blank single:19197::"yes","no"} Headache: {Blank single:19197::"yes","no"} Face pain: {Blank single:19197::"yes","no"} Toothache: {Blank single:19197::"yes","no"} Ear pain: {Blank single:19197::"yes","no"} {Blank single:19197::""right","left",  "bilateral"} Ear pressure: {Blank single:19197::"yes","no"} {Blank single:19197::""right","left", "bilateral"} Eyes red/itching:{Blank single:19197::"yes","no"} Eye drainage/crusting: {Blank single:19197::"yes","no"}  Vomiting: {Blank single:19197::"yes","no"} Rash: {Blank single:19197::"yes","no"} Fatigue: {Blank single:19197::"yes","no"} Sick contacts: {Blank single:19197::"yes","no"} Strep contacts: {Blank single:19197::"yes","no"}  Context: {Blank multiple:19196::"better","worse","stable","fluctuating"} Recurrent sinusitis: {Blank single:19197::"yes","no"} Relief with OTC cold/cough medications: {Blank single:19197::"yes","no"}  Treatments attempted: {Blank multiple:19196::"none","cold/sinus","mucinex","anti-histamine","pseudoephedrine","cough syrup","antibiotics"}    Patient Active Problem List   Diagnosis Date Noted   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  No family history on file.  Social History   Tobacco Use   Smoking status: Never   Smokeless tobacco: Never  Substance Use Topics   Alcohol use: No   Drug use: No     Current Outpatient Medications:    Ascorbic Acid (VITAMIN C) 1000 MG tablet, Take 1,000 mg by mouth., Disp: , Rfl:    bacitracin 500 UNIT/GM ointment, Apply 1 application topically 2 (two) times daily. (Patient not taking: Reported on 05/02/2016), Disp: 15 g, Rfl: 0   calcitRIOL (ROCALTROL) 0.25 MCG capsule, Take 0.25 mcg by mouth daily. Reported on 05/02/2016, Disp: , Rfl: 11   lisinopril (PRINIVIL,ZESTRIL) 5 MG tablet, Take 5 mg by mouth., Disp: , Rfl:    Melatonin 5 MG CAPS, Take 5 mg by mouth. Reported on 05/02/2016, Disp: , Rfl:   No Known Allergies  ROS: See pertinent positives and negatives per HPI.  OBSERVATIONS/OBJECTIVE:  VITALS per patient  if applicable: There were no vitals filed for this visit. There is no height or weight on file to calculate BMI.    GENERAL: Alert and oriented. Appears well and in no acute distress.  HEENT: Atraumatic. Conjunctiva clear. No obvious abnormalities on inspection of external nose and ears.  NECK: Normal movements of the head and neck.  LUNGS: On inspection, no signs of respiratory distress. Breathing rate appears normal. No obvious gross SOB, gasping or wheezing, and no conversational dyspnea.  CV: No obvious cyanosis.  MS: Moves all visible extremities without noticeable abnormality.  PSYCH/NEURO: Pleasant and cooperative.  No obvious depression or anxiety. Speech and thought processing grossly intact.  ASSESSMENT AND PLAN:  There are no diagnoses linked to this encounter.   I discussed the assessment and treatment plan with the patient. The patient was provided an opportunity to ask questions and all were answered. The patient agreed with the plan and demonstrated an understanding of the instructions.   The patient was advised to call back or seek an in-person evaluation if the symptoms worsen or if the condition fails to improve as anticipated.   Charyl Dancer, NP

## 2021-11-16 NOTE — Progress Notes (Deleted)
Called patient several times to start video visit, no answer or sent to voicemail. Sw, cma
